# Patient Record
Sex: Male | Born: 1970 | Race: White | Hispanic: No | Marital: Married | Smoking: Former smoker
Health system: Southern US, Community
[De-identification: ages and names within clinical notes are randomized; demographics above are authoritative.]

## PROBLEM LIST (undated history)

## (undated) DIAGNOSIS — K219 Gastro-esophageal reflux disease without esophagitis: Secondary | ICD-10-CM

---

## 2007-04-01 ENCOUNTER — Emergency Department (HOSPITAL_COMMUNITY): Admission: EM | Admit: 2007-04-01 | Discharge: 2007-04-01 | Payer: Self-pay | Admitting: Family Medicine

## 2019-11-27 ENCOUNTER — Ambulatory Visit: Payer: BC Managed Care – PPO | Attending: Internal Medicine

## 2019-11-27 DIAGNOSIS — Z23 Encounter for immunization: Secondary | ICD-10-CM

## 2019-11-27 NOTE — Progress Notes (Signed)
   Covid-19 Vaccination Clinic  Name:  DRAYDON CLAIRMONT    MRN: [id number] DOB: 24-Jun-1971  11/27/2019  Mr. Vi was observed post Covid-19 immunization for 15 minutes without incident. He was provided with Vaccine Information Sheet and instruction to access the V-Safe system.   Mr. Kohlmeyer was instructed to call 911 with any severe reactions post vaccine: Marland Kitchen Difficulty breathing  . Swelling of face and throat  . A fast heartbeat  . A bad rash all over body  . Dizziness and weakness   Immunizations Administered    Name Date Dose VIS Date Route   Pfizer COVID-19 Vaccine 11/27/2019  1:01 PM 0.3 mL 08/22/2019 Intramuscular   Manufacturer: [employer], [employer]   Lot: [id number]   NDC: [address]-4

## 2019-12-22 ENCOUNTER — Ambulatory Visit: Payer: BC Managed Care – PPO | Attending: Internal Medicine

## 2019-12-22 DIAGNOSIS — Z23 Encounter for immunization: Secondary | ICD-10-CM

## 2019-12-22 NOTE — Progress Notes (Signed)
   Covid-19 Vaccination Clinic  Name:  Tom Ellison    MRN: [id number] DOB: 10/20/1970  12/22/2019  Tom Ellison was observed post Covid-19 immunization for 15 minutes without incident. He was provided with Vaccine Information Sheet and instruction to access the V-Safe system.   Tom Ellison was instructed to call 911 with any severe reactions post vaccine: Marland Kitchen Difficulty breathing  . Swelling of face and throat  . A fast heartbeat  . A bad rash all over body  . Dizziness and weakness   Immunizations Administered    Name Date Dose VIS Date Route   Pfizer COVID-19 Vaccine 12/22/2019  2:01 PM 0.3 mL 08/22/2019 Intramuscular   Manufacturer: [employer], [employer]   Lot: [id number]   NDC: [address]-2

## 2020-04-25 ENCOUNTER — Emergency Department (HOSPITAL_COMMUNITY)
Admission: EM | Admit: 2020-04-25 | Discharge: 2020-04-26 | Disposition: A | Discharge status: Home / Self Care | Payer: BC Managed Care – PPO | Attending: Emergency Medicine | Admitting: Emergency Medicine

## 2020-04-25 ENCOUNTER — Other Ambulatory Visit: Payer: Self-pay

## 2020-04-25 ENCOUNTER — Emergency Department (HOSPITAL_COMMUNITY): Payer: BC Managed Care – PPO

## 2020-04-25 ENCOUNTER — Encounter (HOSPITAL_COMMUNITY): Payer: Self-pay | Admitting: Emergency Medicine

## 2020-04-25 DIAGNOSIS — Z5321 Procedure and treatment not carried out due to patient leaving prior to being seen by health care provider: Secondary | ICD-10-CM | POA: Diagnosis not present

## 2020-04-25 DIAGNOSIS — R0789 Other chest pain: Secondary | ICD-10-CM | POA: Insufficient documentation

## 2020-04-25 HISTORY — DX: Gastro-esophageal reflux disease without esophagitis: K21.9

## 2020-04-25 LAB — CBC
HCT: 46.4 % (ref 39.0–52.0)
Hemoglobin: 15.6 g/dL (ref 13.0–17.0)
MCH: 29.4 pg (ref 26.0–34.0)
MCHC: 33.6 g/dL (ref 30.0–36.0)
MCV: 87.4 fL (ref 80.0–100.0)
Platelets: 313 10*3/uL (ref 150–400)
RBC: 5.31 MIL/uL (ref 4.22–5.81)
RDW: 12.6 % (ref 11.5–15.5)
WBC: 8.1 10*3/uL (ref 4.0–10.5)
nRBC: 0 % (ref 0.0–0.2)

## 2020-04-25 LAB — BASIC METABOLIC PANEL
Anion gap: 11 (ref 5–15)
BUN: 13 mg/dL (ref 6–20)
CO2: 25 mmol/L (ref 22–32)
Calcium: 9.5 mg/dL (ref 8.9–10.3)
Chloride: 102 mmol/L (ref 98–111)
Creatinine, Ser: 0.89 mg/dL (ref 0.61–1.24)
GFR calc Af Amer: >60 mL/min (ref >60)
GFR calc non Af Amer: >60 mL/min (ref >60)
Glucose, Bld: 283 mg/dL — ABNORMAL HIGH (ref 70–99)
Potassium: 4 mmol/L (ref 3.5–5.1)
Sodium: 138 mmol/L (ref 135–145)

## 2020-04-25 LAB — TROPONIN I (HIGH SENSITIVITY): Troponin I (High Sensitivity): 3 ng/L (ref <18)

## 2020-04-25 NOTE — ED Triage Notes (Signed)
Pt c/o intermittent, non-radiating left sided chest pain x 2 days. Denies shortness of breath.

## 2020-04-26 ENCOUNTER — Encounter: Payer: Self-pay | Admitting: Cardiology

## 2020-04-26 ENCOUNTER — Ambulatory Visit: Payer: BC Managed Care – PPO | Admitting: Cardiology

## 2020-04-26 VITALS — BP 153/82 | HR 72 | Resp 16 | Ht 68.0 in | Wt 179.8 lb

## 2020-04-26 DIAGNOSIS — Z794 Long term (current) use of insulin: Secondary | ICD-10-CM | POA: Insufficient documentation

## 2020-04-26 DIAGNOSIS — I10 Essential (primary) hypertension: Secondary | ICD-10-CM | POA: Insufficient documentation

## 2020-04-26 DIAGNOSIS — I209 Angina pectoris, unspecified: Secondary | ICD-10-CM

## 2020-04-26 DIAGNOSIS — E119 Type 2 diabetes mellitus without complications: Secondary | ICD-10-CM

## 2020-04-26 LAB — CBG MONITORING, ED: Glucose-Capillary: 167 mg/dL — ABNORMAL HIGH (ref 70–99)

## 2020-04-26 MED ORDER — ASPIRIN 81 MG PO CHEW: 81.0000 mg | CHEWABLE_TABLET | Freq: Every day | ORAL | 2 refills | Status: AC

## 2020-04-26 MED ORDER — METOPROLOL TARTRATE 50 MG PO TABS: 50.0000 mg | ORAL_TABLET | Freq: Two times a day (BID) | ORAL | 3 refills | Status: AC

## 2020-04-26 MED ORDER — NITROGLYCERIN 0.4 MG SL SUBL: 0.4000 mg | SUBLINGUAL_TABLET | SUBLINGUAL | 3 refills | Status: AC | PRN

## 2020-04-26 NOTE — ED Notes (Signed)
Pt stated he saw labs on my chart and did not want to wait to see a doctor and left the building

## 2020-04-26 NOTE — Progress Notes (Signed)
Patient referred by Tom Ellison, [place] for chest pain  Subjective:   Tom Ellison, male    DOB: [birth year], 49 y.o.   MRN: [id number]   Chief Complaint  Patient presents with  . Chest Pain  . New Patient (Initial Visit)    Referred by PCP Tom Aly, PA     HPI  49 year old male with hypertension, type 2 DM, tobacco dependence, here for evaluation of chest pain.  [facility] went to [facility] on 04/25/2020 with complaints of chest pain.  EKG did not show acute myocardial infarction.  Troponin HS was 3.  Left the ED before being evaluated by physician, due long wait time. His PCP referred him for an acute visit today with me.  Patient is here with his wife today. On 8/14, patient had episodes of "jolt in his chest" while at rest, but worse with exertion. Episodes are also associated with shortness of breath. He has not done any significant physical activity since then.   Patient has longstanding hypertension, type 2 DM (HbA1C 7.1%), former smoker, and continues to chew tobacco.    Past Medical History:  Diagnosis Date  . GERD (gastroesophageal reflux disease)      History reviewed. No pertinent surgical history.   Social History   Tobacco Use  Smoking Status Current Every Day Smoker    Social History   Substance and Sexual Activity  Alcohol Use Yes     Family History  Problem Relation Age of Onset  . Heart attack Father      Current Outpatient Medications on File Prior to Visit  Medication Sig Dispense Refill  . atorvastatin (LIPITOR) 20 MG tablet Take 20 mg by mouth every evening.    . gabapentin (NEURONTIN) 300 MG capsule Take 600 mg by mouth 2 (two) times daily.     . Insulin Detemir (LEVEMIR FLEXPEN [place]) Inject 58 Units into the skin every evening.    Marland Kitchen losartan (COZAAR) 100 MG tablet Take 100 mg by mouth daily.    . metFORMIN (GLUCOPHAGE) 1000 MG tablet Take 1,000 mg by mouth 2 (two) times daily with a meal.    . sitaGLIPtin (JANUVIA) 100  MG tablet Take 100 mg by mouth daily.    . Testosterone Cypionate 200 MG/ML SOLN Inject 200 mg as directed every 21 ( twenty-one) days.      No current facility-administered medications on file prior to visit.    Cardiovascular and other pertinent studies:  EKG 04/25/2020: Sinus rhythm 91 bpm with sinus arrhythmia.  Left intrafascicular block.  Poor R wave progression.  No ischemic changes.   Recent labs: 04/25/2020: Glucose 283. BUN/Cr 13/0.89. eGFR >60. Na/K 138/4.2. H/H 15/46. MCV 87. Platelets 313 Trop HS 3   Review of Systems  Cardiovascular: Positive for chest pain and dyspnea on exertion. Negative for leg swelling, palpitations and syncope.  Respiratory: Positive for shortness of breath.         Vitals:   04/26/20 1334  BP: (!) 153/82  Pulse: 72  Resp: 16  SpO2: 97%     Body mass index is 27.34 kg/m. Filed Weights   04/26/20 1334  Weight: 179 lb 12.8 oz (81.6 kg)     Objective:   Physical Exam Vitals and nursing note reviewed.  Constitutional:      General: He is not in acute distress. Neck:     Vascular: No JVD.  Cardiovascular:     Rate and Rhythm: Normal rate and regular rhythm.  Heart sounds: Normal heart sounds. No murmur heard.   Pulmonary:     Effort: Pulmonary effort is normal.     Breath sounds: Normal breath sounds. No wheezing or rales.         Assessment & Recommendations:   49 year old male with hypertension, type 2 DM, tobacco dependence, family h/o CAD, referred for evaluation of chest pain.  Chest pain: Concerning for angina.  Recommend Aspirin 81 mg, metoprolol tartarate 50 mg bid, prn SL NTG. Continue atorvastatin 20 mg, losartan 100 mg.  Recommend coronary CT angiogram.  Patient knows to call 911 should he have chest pain episode not resolved with NTGX2.  Hypertension: Uncontrolled. Added metoprolol.  Type 2 DM: Management as per PCP  Further recommendations after above testing.   Thank you for referring the  patient to [name]. Please feel free to contact with any questions.  Lenetta Piche Esther Hardy, MD [place], MD [facility] Pager: [phone] Office: [phone]

## 2020-04-26 NOTE — Patient Instructions (Signed)
Your cardiac CT will be scheduled at the locations below:   [facility]  [address]  [place], [place] [address]  [phone]    If scheduled at [facility], please arrive at the [facility] main entrance of [facility] 30-45 minutes prior to test start time.  Proceed to the [facility] Radiology Department (first floor) to check-in and test prep.   Please follow these instructions carefully (unless otherwise directed):    Hold all erectile dysfunction medications at least 3 days (72 hrs) prior to test.   On the Night Before the Test:   Be sure to Drink plenty of water.   Do not consume any caffeinated/decaffeinated beverages or chocolate 12 hours prior to your test.   Do not take any antihistamines 12 hours prior to your test.   If the patient has contrast allergy:  Patient will need a prescription for Prednisone and very clear instructions (as follows):  1. Prednisone 50 mg - take 13 hours prior to test  2. Take another Prednisone 50 mg 7 hours prior to test  3. Take another Prednisone 50 mg 1 hour prior to test  4. Take Benadryl 50 mg 1 hour prior to test   Patient must complete all four doses of above prophylactic medications.   Patient will need a ride after test due to Benadryl.   On the Day of the Test:   Drink plenty of water. Do not drink any water within one hour of the test.   Do not eat any food 4 hours prior to the test.   You may take your regular medications prior to the test.    - Metoprolol tartarate Take your 50 mg tablet 2 hours before CT scan         After the Test:   Drink plenty of water.   After receiving IV contrast, you may experience a mild flushed feeling. This is normal.   On occasion, you may experience a mild rash up to 24 hours after the test. This is not dangerous. If this occurs, you can take Benadryl 25 mg and increase your fluid intake.   If you experience trouble breathing, this can be  serious. If it is severe call 911 IMMEDIATELY. If it is mild, please call our office.   If you take any of these medications: Glipizide/Metformin, Avandament, Glucavance, please do not take 48 hours after completing test unless otherwise instructed.     Please contact the cardiac imaging nurse navigator should you have any questions/concerns  Rockwell Alexandria, RN Navigator Cardiac Imaging  [facility]  [phone] Office  [phone] Cell

## 2020-04-28 ENCOUNTER — Other Ambulatory Visit: Payer: Self-pay

## 2020-04-28 ENCOUNTER — Ambulatory Visit: Payer: BC Managed Care – PPO

## 2020-04-28 DIAGNOSIS — I209 Angina pectoris, unspecified: Secondary | ICD-10-CM

## 2020-04-30 ENCOUNTER — Encounter: Payer: Self-pay | Admitting: Cardiology

## 2020-05-11 ENCOUNTER — Telehealth (HOSPITAL_COMMUNITY): Payer: Self-pay | Admitting: *Deleted

## 2020-05-11 NOTE — Telephone Encounter (Signed)

## 2020-05-12 ENCOUNTER — Other Ambulatory Visit: Payer: Self-pay

## 2020-05-12 ENCOUNTER — Ambulatory Visit (HOSPITAL_COMMUNITY)
Admission: RE | Admit: 2020-05-12 | Discharge: 2020-05-12 | Disposition: A | Discharge status: Home / Self Care | Payer: BC Managed Care – PPO | Source: Ambulatory Visit | Attending: Cardiology | Admitting: Cardiology

## 2020-05-12 DIAGNOSIS — I209 Angina pectoris, unspecified: Secondary | ICD-10-CM | POA: Insufficient documentation

## 2020-05-12 MED ORDER — NITROGLYCERIN 0.4 MG SL SUBL
0.8000 mg | SUBLINGUAL_TABLET | Freq: Once | SUBLINGUAL | Status: AC
Start: 2020-05-12 — End: 2020-05-12

## 2020-05-12 MED ORDER — IOHEXOL 350 MG/ML SOLN
80.0000 mL | Freq: Once | INTRAVENOUS | Status: AC | PRN
  Administered 2020-05-12: 80 mL via INTRAVENOUS

## 2020-05-12 MED ORDER — NITROGLYCERIN 0.4 MG SL SUBL
SUBLINGUAL_TABLET | SUBLINGUAL | Status: AC
  Administered 2020-05-12: 0.8 mg via SUBLINGUAL
  Filled 2020-05-12: qty 2

## 2020-05-15 ENCOUNTER — Ambulatory Visit (HOSPITAL_COMMUNITY)
Admission: RE | Admit: 2020-05-15 | Discharge: 2020-05-15 | Disposition: A | Discharge status: Home / Self Care | Payer: BC Managed Care – PPO | Source: Ambulatory Visit | Attending: Cardiology | Admitting: Cardiology

## 2020-05-15 DIAGNOSIS — I209 Angina pectoris, unspecified: Secondary | ICD-10-CM

## 2020-05-18 DIAGNOSIS — R0789 Other chest pain: Secondary | ICD-10-CM | POA: Insufficient documentation

## 2020-05-18 NOTE — Progress Notes (Signed)
Patient referred by Vicenta Aly, [place] for chest pain  Subjective:   Tom Ellison, male    DOB: Dec 04, 1970, 49 y.o.   MRN: [id number]   Chief Complaint  Patient presents with  . Chest Pain  . Follow-up     HPI  49 year old male with hypertension, type 2 DM, tobacco dependence, nonobstructive CAD, family h/o CAD  Patient underwent CCTA given symptoms of chest pain. CTA showed mild nonobstructive (FFR negative) CAD in prox LAD and RCA.   He continues to have exertional dyspnea activity such as walking to the mailbox and back.  The symptoms have been fairly abrupt onset over the last few weeks.  He still has occasional episodes of sharp chest pain lasting for few seconds, unrelated to physical activity.  Current Outpatient Medications on File Prior to Visit  Medication Sig Dispense Refill  . aspirin (ASPIRIN CHILDRENS) 81 MG chewable tablet Chew 1 tablet (81 mg total) by mouth daily. 30 tablet 2  . atorvastatin (LIPITOR) 20 MG tablet Take 20 mg by mouth every evening.    . gabapentin (NEURONTIN) 300 MG capsule Take 600 mg by mouth 2 (two) times daily.     . Insulin Detemir (LEVEMIR FLEXPEN [place]) Inject 58 Units into the skin every evening.    Marland Kitchen losartan (COZAAR) 100 MG tablet Take 100 mg by mouth daily.    . metFORMIN (GLUCOPHAGE) 1000 MG tablet Take 1,000 mg by mouth 2 (two) times daily with a meal.    . metoprolol tartrate (LOPRESSOR) 50 MG tablet Take 1 tablet (50 mg total) by mouth 2 (two) times daily. 60 tablet 3  . nitroGLYCERIN (NITROSTAT) 0.4 MG SL tablet Place 1 tablet (0.4 mg total) under the tongue every 5 (five) minutes as needed for chest pain. 30 tablet 3  . sitaGLIPtin (JANUVIA) 100 MG tablet Take 100 mg by mouth daily.    . Testosterone Cypionate 200 MG/ML SOLN Inject 200 mg as directed every 21 ( twenty-one) days.      No current facility-administered medications on file prior to visit.    Cardiovascular and other pertinent studies:  CTA cor 05/12/2020: 1.  Coronary calcium score of 39 AU. This was 3 percentile for age and sex matched control. 2. Normal coronary origin with right dominance. 3. CADRADS = 2. 4. Proximal LAD has mild stenosis in due to mixed plaque. LCX is non dominant and overall patent. Proximal RCA has mild stenosis due to noncalcified plaque. 5.  Aortic atherosclerosis. CT FFR findings: 1. Left Main: FFR = 0.99 2. LAD: Proximal FFR = 0.98, Mid FFR = 0.94, Distal FFR = 0.85 3. LCX: Proximal FFR = 0.98, Distal FFR = 0.94 4. RCA: Proximal FFR = 0.97, Mid FFR =0.91, Distal FFR = 0.87  IMPRESSION: 1.  CT FFR analysis showed no significant stenosis.  Echocardiogram 04/28/2020:  Left ventricle cavity is normal in size and wall thickness. Mild global  hypokinesis. LVEF 45-50%. Doppler evidence of grade II (pseudonormal)  diastolic dysfunction, elevated LAP. Calculated EF 55%.  Left atrial cavity is mildly dilated.  Moderate (Grade II) mitral regurgitation.  Inadequate TR jet to estimate pulmonary artery systolic pressure. Normal  right atrial pressure.   EKG 04/25/2020: Sinus rhythm 91 bpm with sinus arrhythmia.  Left intrafascicular block.  Poor R wave progression.  No ischemic changes.   Recent labs: 04/25/2020: Glucose 283. BUN/Cr 13/0.89. eGFR >60. Na/K 138/4.2. H/H 15/46. MCV 87. Platelets 313 Trop HS 3   Review of Systems  Cardiovascular: Positive  for chest pain and dyspnea on exertion. Negative for leg swelling, palpitations and syncope.  Respiratory: Positive for shortness of breath.         Vitals:   05/19/20 1611  BP: 122/85  Pulse: 76  Resp: 16  SpO2: 96%     Body mass index is 27.37 kg/m. Filed Weights   05/19/20 1611  Weight: 180 lb (81.6 kg)     Objective:   Physical Exam Vitals and nursing note reviewed.  Constitutional:      General: He is not in acute distress. Neck:     Vascular: No JVD.  Cardiovascular:     Rate and Rhythm: Normal rate and regular rhythm.     Heart  sounds: Normal heart sounds. No murmur heard.   Pulmonary:     Effort: Pulmonary effort is normal.     Breath sounds: Normal breath sounds. No wheezing or rales.         Assessment & Recommendations:   50 year old male with hypertension, type 2 DM, tobacco dependence, nonobstructive CAD, family h/o CAD  Exertional dyspnea, atypical chest pain: Mild nonobstructive CAD seen on CTA, mild systolic and diastolic dysfunction do not fully explain his symptoms.  While COPD is possible, fairly abrupt onset of symptoms raise concern about pulmonary embolism.  I discussed proceeding with CTA versus D-dimer.  Patient would like to obtain D-dimer first.  If positive, will proceed with CTA.  Given his smoking history, VQ scan likely not a good test for him.  CAD: Mild, nonobstructive. Recommend aggressive risk factor modification. Recommend Aspirin 81 mg, metoprolol tartarate 50 mg bid, losartan 100 mg.  Increase atorvastatin to 40 mg   Hypertension:  Well controlled on metoprolol and losartan  Type 2 DM: Management as per PCP  Repeat lipid panel and echocardiogram in 6 months, follow-up after that.   Nigel Mormon, MD Pager: [phone] Office: [phone]

## 2020-05-19 ENCOUNTER — Other Ambulatory Visit: Payer: Self-pay

## 2020-05-19 ENCOUNTER — Encounter: Payer: Self-pay | Admitting: Cardiology

## 2020-05-19 ENCOUNTER — Ambulatory Visit: Payer: BC Managed Care – PPO | Admitting: Cardiology

## 2020-05-19 VITALS — BP 122/85 | HR 76 | Resp 16 | Ht 68.0 in | Wt 180.0 lb

## 2020-05-19 DIAGNOSIS — I10 Essential (primary) hypertension: Secondary | ICD-10-CM

## 2020-05-19 DIAGNOSIS — I2510 Atherosclerotic heart disease of native coronary artery without angina pectoris: Secondary | ICD-10-CM

## 2020-05-19 DIAGNOSIS — R0609 Other forms of dyspnea: Secondary | ICD-10-CM

## 2020-05-19 DIAGNOSIS — R0789 Other chest pain: Secondary | ICD-10-CM

## 2020-05-19 DIAGNOSIS — E119 Type 2 diabetes mellitus without complications: Secondary | ICD-10-CM

## 2020-05-19 MED ORDER — ATORVASTATIN CALCIUM 40 MG PO TABS: 40.0000 mg | ORAL_TABLET | Freq: Every evening | ORAL | 2 refills | Status: DC

## 2020-05-25 LAB — D-DIMER, QUANTITATIVE: D-DIMER: <0.2 mg/L FEU (ref 0.00–0.49)

## 2020-06-09 ENCOUNTER — Other Ambulatory Visit: Payer: Self-pay

## 2020-06-09 ENCOUNTER — Encounter: Payer: Self-pay | Admitting: Pulmonary Disease

## 2020-06-09 ENCOUNTER — Ambulatory Visit (INDEPENDENT_AMBULATORY_CARE_PROVIDER_SITE_OTHER): Payer: BC Managed Care – PPO | Admitting: Pulmonary Disease

## 2020-06-09 VITALS — BP 120/78 | HR 69 | Temp 97.8°F | Ht 68.0 in | Wt 189.0 lb

## 2020-06-09 DIAGNOSIS — J45909 Unspecified asthma, uncomplicated: Secondary | ICD-10-CM | POA: Insufficient documentation

## 2020-06-09 DIAGNOSIS — R0600 Dyspnea, unspecified: Secondary | ICD-10-CM

## 2020-06-09 DIAGNOSIS — R0609 Other forms of dyspnea: Secondary | ICD-10-CM

## 2020-06-09 DIAGNOSIS — R05 Cough: Secondary | ICD-10-CM

## 2020-06-09 DIAGNOSIS — R059 Cough, unspecified: Secondary | ICD-10-CM

## 2020-06-09 DIAGNOSIS — J4540 Moderate persistent asthma, uncomplicated: Secondary | ICD-10-CM

## 2020-06-09 MED ORDER — BREO ELLIPTA 100-25 MCG/INH IN AEPB: 1.0000 | INHALATION_SPRAY | Freq: Every day | RESPIRATORY_TRACT | 11 refills | Status: DC

## 2020-06-09 NOTE — Progress Notes (Signed)
Patient ID: Tom Ellison, male    DOB: [birth year], 49 y.o.   MRN: [id number]  Chief Complaint  Patient presents with  . Consult    SOB while talking and walking. Also having some chest pain. Dry cough    Referring provider: Elizabeth PalauAnderson, Teresa, FNP  HPI:   Tom Ellison is a 49 year old man with past meters of diabetes, hypertension who are seen in consultation at the request of Elizabeth Palaueresa Anderson, NP for evaluation of dyspnea on exertion.  There is from referring provider as well as cardiology reviewed.  States that he developed shortness of breath out of the blue about 1 month ago.  Short of breath walking to be mailbox.  Short of breath at work going up and down ladders, performing tasks.  Occasion become somewhat short of breath at rest as well.  He describes in the past history of asthma.  In the past clear triggers of what sound like organic dusts and fumes.  He describes more recent triggers as smells such as cooking meat in the house.  This is associated with cough.  Is nonproductive.  Occurs throughout the day.  No clear timing.  No clear exacerbating or alleviating factors.  Rest seems to help the shortness of breath when he is exerting himself.  He has used albuterol prescribed by his PCP that seems to take some of the edge off of the shortness of breath and this helps somewhat.  Reviewed recent CT chest portion of CT coronary which demonstrates on my interpretation clear lungs, apices not well visualized.  Review chest x-ray from August 2021 which demonstrate clear lungs on my interpretation.  Reviewed prior lab results and eosinophils ranged from 1-200.  No IgE in the system.  PMH: Diabetes, hypertension, hyperlipidemia Surgical history: Reviewed, none. Family history: CAD in father Social history: [place] in [place], former smoker, 30-pack-year history   Questionaires / Pulmonary Flowsheets:   ACT:  No flowsheet data found.  MMRC: No flowsheet data found.  Epworth:  No  flowsheet data found.  Tests:   FENO:  No results found for: NITRICOXIDE  PFT: No flowsheet data found.  WALK:  No flowsheet data found.  Imaging: CT CORONARY MORPH W/CTA COR W/SCORE W/CA W/CM &/OR WO/CM  Addendum Date: 05/15/2020   ADDENDUM REPORT: 05/15/2020 17:09 HISTORY: 49 year old male with hypertension, type 2 DM, tobacco dependence, family h/o CAD, referred for evaluation of chest pain after recent ER visit. EXAM: Cardiac/Coronary  CT TECHNIQUE: The patient was scanned on a [employer] Force scanner. PROTOCOL: A 120 kV prospective scan was triggered in the descending thoracic aorta at 111 HU's. Axial non-contrast 3 mm slices were carried out through the heart. The data set was analyzed on a dedicated work station and scored using the Agatson method. Gantry rotation speed was 250 msecs and collimation was .6 mm. No IV beta blockade and 0.8 mg of sl NTG was given. The 3D data set was reconstructed in 5% intervals of the 67-82 % of the R-R cycle. Diastolic phases were analyzed on a dedicated work station using MPR, MIP and VRT modes. The patient received 80mL OMNIPAQUE IOHEXOL 350 MG/ML SOLN of contrast. FINDINGS: Image quality: Excellent. Noise artifact is: Limited. Coronary artery calcification score: Coronary calcium score is 39 AU, which places the patient in the 4882 percentile for age and sex matched control. Coronary arteries: Normal coronary origins.  Right dominance. Left Main Coronary Artery: The left main is a normal caliber vessel that bifurcates to form a left  anterior descending artery and a left circumflex artery. There is no plaque or stenosis. Left Anterior Descending Coronary Artery: The LAD gives off 1 patent diagonal branches and wraps the apex. Proximal segment has mild stenosis (25-49%) due to calcified and noncalcified plaque. Proximal to mid segment has minimal stenosis (<25%) due to calcified plaque. Remaining of the vessel is overall patent with luminal irregularities.  Diagonal 1 branch is overall pain without plaque or stenosis. Left Circumflex Artery: The LCX gives off 1 patent obtuse marginal branches. The LCX is non-dominant and patent with no evidence of plaque or stenosis. Right Coronary Artery: The RCA is dominant vessel which terminates as a PDA and right posterolateral branch. Minimal stenosis (<25%) due to calcified plaque at the ostial RCA. Proximal segment has mild stenosis (24-49%) due to noncalcified plaque. Remaining of the vessels overall patent without plaque or stenosis. Left Atrium: No left atrial appendage filling defect. Left Ventricle: The ventricular cavity size is grossly within normal limits. There is no abnormal filling defect. No VSD. Pulmonary arteries: Main pulmonary branch measures 32 mm without proximal filling defect. Pulmonary veins: Normal pulmonary venous drainage. Aorta: Normal size, 32 mm at the mid ascending aorta (level of the PA bifurcation) measured double oblique. Aortic atherosclerosis. No dissection. Pericardium: Normal thickness with no significant effusion or calcium present. Cardiac valves: The aortic valve without significant calcification. The mitral valve is normal structure with mitral annular calcification. Extra-cardiac findings: See attached radiology report for non-cardiac structures. IMPRESSION: 1. Coronary calcium score of 39 AU. This was 52 percentile for age and sex matched control. 2. Normal coronary origin with right dominance. 3. CADRADS = 2. 4. Proximal LAD has mild stenosis in due to mixed plaque. LCX is non dominant and overall patent. Proximal RCA has mild stenosis due to noncalcified plaque. 5.  Aortic atherosclerosis. 6. Study is sent for CT-FFR findings will be performed and reported separately. RECOMMENDATIONS: CAD-RADS 2: Mild non-obstructive CAD (25-49%). Consider non-atherosclerotic causes of chest pain. Consider preventive therapy and risk factor modification. Electronically Signed   By: Tessa Lerner   On:  05/15/2020 17:09   Result Date: 05/15/2020 EXAM: OVER-READ INTERPRETATION  CT CHEST The following report is an over-read performed by radiologist Dr. Trudie Reed of [facility] on 05/12/2020. This over-read does not include interpretation of cardiac or coronary anatomy or pathology. The coronary calcium score/coronary CTA interpretation by the cardiologist is attached. COMPARISON:  None. FINDINGS: Aortic atherosclerosis. Within the visualized portions of the thorax there are no suspicious appearing pulmonary nodules or masses, there is no acute consolidative airspace disease, no pleural effusions, no pneumothorax and no lymphadenopathy. Visualized portions of the upper abdomen are unremarkable. There are no aggressive appearing lytic or blastic lesions noted in the visualized portions of the skeleton. IMPRESSION: 1.  Aortic Atherosclerosis (ICD10-I70.0). Electronically Signed: By: Trudie Reed M.D. On: 05/12/2020 13:29   CT CORONARY FRACTIONAL FLOW RESERVE DATA PREP  Result Date: 05/16/2020 EXAM: CT FFR ANALYSIS CLINICAL DATA:  49 year old male with hypertension, type 2 DM, tobacco dependence, family history of CAD, referred for evaluation of chest pain after recent ER visit. FINDINGS: FFRct analysis was performed on the original cardiac CT angiogram dataset. Diagrammatic representation of the FFRct analysis is provided in a separate PDF document in PACS. This dictation was created using the PDF document and an interactive 3D model of the results. 3D model is not available in the EMR/PACS. Normal FFR range is >0.80. Indeterminate (grey) zone is 0.76-0.80. 1. Left Main: FFR =  0.99 2. LAD: Proximal FFR = 0.98, Mid FFR = 0.94, Distal FFR = 0.85 3. LCX: Proximal FFR = 0.98, Distal FFR = 0.94 4. RCA: Proximal FFR = 0.97, Mid FFR =0.91, Distal FFR = 0.87 IMPRESSION: 1.  CT FFR analysis showed no significant stenosis. RECOMMENDATIONS: Goal directed medical therapy and aggressive risk factor  modification for secondary prevention of coronary artery disease. Electronically Signed   By: Tessa Lerner   On: 05/16/2020 13:00    Lab Results:  CBC    Component Value Date/Time   WBC 8.1 04/25/2020 1907   RBC 5.31 04/25/2020 1907   HGB 15.6 04/25/2020 1907   HCT 46.4 04/25/2020 1907   PLT 313 04/25/2020 1907   MCV 87.4 04/25/2020 1907   MCH 29.4 04/25/2020 1907   MCHC 33.6 04/25/2020 1907   RDW 12.6 04/25/2020 1907    BMET    Component Value Date/Time   NA 138 04/25/2020 1907   K 4.0 04/25/2020 1907   CL 102 04/25/2020 1907   CO2 25 04/25/2020 1907   GLUCOSE 283 (H) 04/25/2020 1907   BUN 13 04/25/2020 1907   CREATININE 0.89 04/25/2020 1907   CALCIUM 9.5 04/25/2020 1907   GFRNONAA >60 04/25/2020 1907   GFRAA >60 04/25/2020 1907    BNP No results found for: BNP  ProBNP No results found for: PROBNP  Specialty Problems      Pulmonary Problems   Asthma      No Known Allergies  Immunization History  Administered Date(s) Administered  . PFIZER SARS-COV-2 Vaccination 11/27/2019, 12/22/2019    Past Medical History:  Diagnosis Date  . GERD (gastroesophageal reflux disease)     Tobacco History: Social History   Tobacco Use  Smoking Status Former Smoker  . Years: 15.00  . Types: Cigarettes  . Quit date: 2012  . Years since quitting: 9.7  Smokeless Tobacco Current User  . Types: Chew   Ready to quit: Not Answered Counseling given: Not Answered   Continue to not smoke  Outpatient Encounter Medications as of 06/09/2020  Medication Sig  . aspirin (ASPIRIN CHILDRENS) 81 MG chewable tablet Chew 1 tablet (81 mg total) by mouth daily.  Marland Kitchen atorvastatin (LIPITOR) 40 MG tablet Take 1 tablet (40 mg total) by mouth every evening.  . gabapentin (NEURONTIN) 300 MG capsule Take 600 mg by mouth 2 (two) times daily.   . Insulin Detemir (LEVEMIR FLEXPEN [place]) Inject 58 Units into the skin every evening.  Marland Kitchen losartan (COZAAR) 100 MG tablet Take 100 mg by mouth daily.    . metFORMIN (GLUCOPHAGE) 1000 MG tablet Take 1,000 mg by mouth 2 (two) times daily with a meal.  . metoprolol tartrate (LOPRESSOR) 50 MG tablet Take 1 tablet (50 mg total) by mouth 2 (two) times daily.  . nitroGLYCERIN (NITROSTAT) 0.4 MG SL tablet Place 1 tablet (0.4 mg total) under the tongue every 5 (five) minutes as needed for chest pain.  . sitaGLIPtin (JANUVIA) 100 MG tablet Take 100 mg by mouth daily.  . Testosterone Cypionate 200 MG/ML SOLN Inject 200 mg as directed every 21 ( twenty-one) days.   Marland Kitchen albuterol (VENTOLIN HFA) 108 (90 Base) MCG/ACT inhaler Inhale 2 puffs into the lungs every 4 (four) hours as needed.  . fluticasone furoate-vilanterol (BREO ELLIPTA) 100-25 MCG/INH AEPB Inhale 1 puff into the lungs daily.   No facility-administered encounter medications on file as of 06/09/2020.     Review of Systems  Review of Systems  No orthopnea or PND.  No  lower extremity swelling.  Comprehensive review of systems otherwise negative. Physical Exam  BP 120/78 (BP Location: Right Arm, Cuff Size: Normal)   Pulse 69   Temp 97.8 F (36.6 C) (Oral)   Ht 5\' 8"  (1.727 m)   Wt 189 lb (85.7 kg)   SpO2 97%   BMI 28.74 kg/m   Wt Readings from Last 5 Encounters:  06/09/20 189 lb (85.7 kg)  05/19/20 180 lb (81.6 kg)  04/26/20 179 lb 12.8 oz (81.6 kg)    BMI Readings from Last 5 Encounters:  06/09/20 28.74 kg/m  05/19/20 27.37 kg/m  04/26/20 27.34 kg/m     Physical Exam General: Well-appearing, no acute distress Eyes: EOMI, no icterus Neck: No JVP appreciated, supple Respiratory: Clear aspiration bilaterally, no wheeze Cardiovascular: Regular rhythm, no murmurs Abdomen: Soft, not sound present MSK: No synovitis, no joint effusions Neuro: Normal gait, no weakness Psych: Normal mood, full affect   Assessment & Plan:   Dyspnea exertion: New, relatively abrupt onset the last month or so.  No clear inciting event.  No URI symptoms that I can tell.  He has history of  asthma and reports clear triggers leading to worsening breathing over the last several weeks.  Suspect this is recrudescence of asthma symptoms.  He has significant diastolic dysfunction and LAD enlargement on echo which points to element of chronic volume overload.  It is possible he has worsening diastolic dysfunction with exercise and dynamic pulmonary edema that leads to symptoms.  However his lung sound clear today and appears euvolemic.  All cough can be present with pulmonary edema better fit for asthma at this time.  We will start ICS/LABA with mid dose of Breo.  Will obtain PFTs in the coming days.  Cough: Suspect related to asthma.  Has no nasal congestion or postnasal drip symptoms.  Already on PPI for GERD denies any heartburn or reflux symptoms.  ICS/LABA as above.    Return in about 3 months (around 09/08/2020).   09/10/2020, MD 06/09/2020

## 2020-06-09 NOTE — Patient Instructions (Signed)
Nice to meet you!  Use Breo 1 puff once a day ever day. This is to treat asthma which I think is the main reason you are having breathing difficulties and cough. Use the albuterol inhaler as needed for episodes of shortness of breath.  Schedule PFTs (breathing tests) in the next few days. Do not use Breo on the day of your breathing tests. You can take it after you have completed the breathing tests.   Come back in 3 months with Dr. Judeth Horn.

## 2020-06-11 ENCOUNTER — Other Ambulatory Visit: Payer: Self-pay

## 2020-06-11 ENCOUNTER — Ambulatory Visit (INDEPENDENT_AMBULATORY_CARE_PROVIDER_SITE_OTHER): Payer: BC Managed Care – PPO | Admitting: Pulmonary Disease

## 2020-06-11 DIAGNOSIS — R0600 Dyspnea, unspecified: Secondary | ICD-10-CM | POA: Diagnosis not present

## 2020-06-11 DIAGNOSIS — R0609 Other forms of dyspnea: Secondary | ICD-10-CM

## 2020-06-11 LAB — PULMONARY FUNCTION TEST
DL/VA % pred: 93 %
DL/VA: 4.19 ml/min/mmHg/L
DLCO cor % pred: 101 %
DLCO cor: 28.22 ml/min/mmHg
DLCO unc % pred: 104 %
DLCO unc: 28.98 ml/min/mmHg
FEF 25-75 Post: 5.49 L/sec
FEF 25-75 Pre: 4.38 L/sec
FEF2575-%Change-Post: 25 %
FEF2575-%Pred-Post: 164 %
FEF2575-%Pred-Pre: 131 %
FEV1-%Change-Post: 8 %
FEV1-%Pred-Post: 110 %
FEV1-%Pred-Pre: 102 %
FEV1-Post: 4.11 L
FEV1-Pre: 3.79 L
FEV1FVC-%Change-Post: 0 %
FEV1FVC-%Pred-Pre: 108 %
FEV6-%Change-Post: 7 %
FEV6-%Pred-Post: 104 %
FEV6-%Pred-Pre: 97 %
FEV6-Post: 4.8 L
FEV6-Pre: 4.47 L
FEV6FVC-%Change-Post: 0 %
FEV6FVC-%Pred-Post: 103 %
FEV6FVC-%Pred-Pre: 103 %
FVC-%Change-Post: 7 %
FVC-%Pred-Post: 101 %
FVC-%Pred-Pre: 94 %
FVC-Post: 4.81 L
FVC-Pre: 4.47 L
Post FEV1/FVC ratio: 85 %
Post FEV6/FVC ratio: 100 %
Pre FEV1/FVC ratio: 85 %
Pre FEV6/FVC Ratio: 100 %
RV % pred: 90 %
RV: 1.73 L
TLC % pred: 100 %
TLC: 6.59 L

## 2020-06-11 NOTE — Progress Notes (Signed)
PFT done today. 

## 2020-11-09 ENCOUNTER — Other Ambulatory Visit: Payer: BC Managed Care – PPO

## 2020-11-12 ENCOUNTER — Other Ambulatory Visit: Payer: BC Managed Care – PPO

## 2020-11-17 ENCOUNTER — Ambulatory Visit: Payer: BC Managed Care – PPO | Admitting: Cardiology

## 2020-11-25 ENCOUNTER — Ambulatory Visit: Payer: BC Managed Care – PPO

## 2020-11-25 ENCOUNTER — Other Ambulatory Visit (HOSPITAL_COMMUNITY): Payer: Self-pay | Admitting: Cardiology

## 2020-11-25 ENCOUNTER — Other Ambulatory Visit: Payer: Self-pay

## 2020-11-25 DIAGNOSIS — I10 Essential (primary) hypertension: Secondary | ICD-10-CM

## 2020-11-26 LAB — LIPID PANEL
Chol/HDL Ratio: 3.2 ratio (ref 0.0–5.0)
Cholesterol, Total: 112 mg/dL (ref 100–199)
HDL: 35 mg/dL — ABNORMAL LOW (ref >39)
LDL Chol Calc (NIH): 63 mg/dL (ref 0–99)
Triglycerides: 66 mg/dL (ref 0–149)
VLDL Cholesterol Cal: 14 mg/dL (ref 5–40)

## 2020-12-01 DIAGNOSIS — I2510 Atherosclerotic heart disease of native coronary artery without angina pectoris: Secondary | ICD-10-CM | POA: Insufficient documentation

## 2020-12-01 DIAGNOSIS — R0609 Other forms of dyspnea: Secondary | ICD-10-CM | POA: Insufficient documentation

## 2020-12-01 DIAGNOSIS — R0600 Dyspnea, unspecified: Secondary | ICD-10-CM | POA: Insufficient documentation

## 2020-12-01 NOTE — Progress Notes (Signed)
Patient referred by Vicenta Aly, [place] for chest pain  Subjective:   Angelica Ran, male    DOB: September 30, 1970, 50 y.o.   MRN: [id number]   Chief Complaint  Patient presents with  . Coronary Artery Disease  . Follow-up  . Results     HPI  50 year old male with hypertension, type 2 DM, smokeless tobacco dependence, nonobstructive CAD, family h/o CAD.   Patient was originally referred to our office for evaluation of chest pain. Patient had undergone CCTA which showed mild nonobstructive CAD in the proximal LAD and RCA. He was last seen 05/19/2020 by Dr. Virgina Jock, at which time he had complaints of exertional dyspnea he therefore underwent echocardiogram and D-dimer.  D-dimer was negative and echocardiogram revealed only mild systolic and diastolic dysfunction.   Patient has had no recurrence of chest pain or dyspnea on exertion.  He is without specific complaints today.  Denies chest pain, palpitations, syncope, near syncope.  Denies leg edema, orthopnea, PND.  Patient reports most recent A1c is 7.5%, he reports he has a recheck coming up.   Current Outpatient Medications on File Prior to Visit  Medication Sig Dispense Refill  . albuterol (VENTOLIN HFA) 108 (90 Base) MCG/ACT inhaler Inhale 2 puffs into the lungs every 4 (four) hours as needed.    Marland Kitchen aspirin (ASPIRIN CHILDRENS) 81 MG chewable tablet Chew 1 tablet (81 mg total) by mouth daily. 30 tablet 2  . atorvastatin (LIPITOR) 40 MG tablet Take 1 tablet (40 mg total) by mouth every evening. 90 tablet 2  . fluticasone furoate-vilanterol (BREO ELLIPTA) 100-25 MCG/INH AEPB Inhale 1 puff into the lungs daily. 60 each 11  . gabapentin (NEURONTIN) 300 MG capsule Take 600 mg by mouth 2 (two) times daily.     . Insulin Detemir (LEVEMIR FLEXPEN [place]) Inject 58 Units into the skin every evening.    Marland Kitchen losartan (COZAAR) 100 MG tablet Take 100 mg by mouth daily.    . metFORMIN (GLUCOPHAGE) 1000 MG tablet Take 1,000 mg by mouth 2 (two) times  daily with a meal.    . nitroGLYCERIN (NITROSTAT) 0.4 MG SL tablet Place 1 tablet (0.4 mg total) under the tongue every 5 (five) minutes as needed for chest pain. 30 tablet 3  . sitaGLIPtin (JANUVIA) 100 MG tablet Take 100 mg by mouth daily.    . Testosterone Cypionate 200 MG/ML SOLN Inject 200 mg as directed every 21 ( twenty-one) days.     . metoprolol tartrate (LOPRESSOR) 50 MG tablet Take 1 tablet (50 mg total) by mouth 2 (two) times daily. 60 tablet 3   No current facility-administered medications on file prior to visit.    Cardiovascular and other pertinent studies:  EKG 12/02/2020:  Sinus rhythm at a rate of 80 bpm.  Left axis, left anterior fascicular block.  Poor R wave progression, cannot exclude anteroseptal infarct old.  No evidence of ischemia or infarct.  Compared to EKG 04/25/2020, no significant change.  Echocardiogram 11/25/2020:  Normal LV systolic function with visual EF 50-55%. Left ventricle cavity  is normal in size. Mild left ventricular hypertrophy. Normal global wall  motion. Indeterminate diastolic filling pattern, normal LAP.  Mild (Grade I) mitral regurgitation.  Trace tricuspid regurgitation. No evidence of pulmonary hypertension.  Compared to prior study dated 04/28/2020: LVEF improved from 45-50% to  50-55%, Moderate MR is now Mild. Otherwise no significant change.   CTA cor 05/12/2020: 1. Coronary calcium score of 39 AU. This was 35 percentile for age and  sex matched control. 2. Normal coronary origin with right dominance. 3. CADRADS = 2. 4. Proximal LAD has mild stenosis in due to mixed plaque. LCX is non dominant and overall patent. Proximal RCA has mild stenosis due to noncalcified plaque. 5.  Aortic atherosclerosis. CT FFR findings: 1. Left Main: FFR = 0.99 2. LAD: Proximal FFR = 0.98, Mid FFR = 0.94, Distal FFR = 0.85 3. LCX: Proximal FFR = 0.98, Distal FFR = 0.94 4. RCA: Proximal FFR = 0.97, Mid FFR =0.91, Distal FFR = 0.87  IMPRESSION: 1.   CT FFR analysis showed no significant stenosis.  Echocardiogram 04/28/2020:  Left ventricle cavity is normal in size and wall thickness. Mild global  hypokinesis. LVEF 45-50%. Doppler evidence of grade II (pseudonormal)  diastolic dysfunction, elevated LAP. Calculated EF 55%.  Left atrial cavity is mildly dilated.  Moderate (Grade II) mitral regurgitation.  Inadequate TR jet to estimate pulmonary artery systolic pressure. Normal  right atrial pressure.   EKG 04/25/2020: Sinus rhythm 91 bpm with sinus arrhythmia.  Left intrafascicular block.  Poor R wave progression.  No ischemic changes.   Recent labs: 11/25/2020: Chol 112, TG 66, HDL 35, LDL 63  04/25/2020:  Glucose 283. BUN/Cr 13/0.89. eGFR >60. Na/K 138/4.2. H/H 15/46. MCV 87. Platelets 313 Trop HS 3   Review of Systems  Constitutional: Negative for malaise/fatigue and weight gain.  Cardiovascular: Negative for chest pain, claudication, dyspnea on exertion, leg swelling, near-syncope, orthopnea, palpitations, paroxysmal nocturnal dyspnea and syncope.  Respiratory: Negative for shortness of breath.   Hematologic/Lymphatic: Does not bruise/bleed easily.  Gastrointestinal: Negative for melena.  Neurological: Negative for dizziness and weakness.        Vitals:   12/02/20 0910  BP: 122/76  Pulse: 95  Temp: 98 F (36.7 C)  SpO2: 99%     Body mass index is 28.43 kg/m. Filed Weights   12/02/20 0910  Weight: 187 lb (84.8 kg)     Objective:   Physical Exam Vitals and nursing note reviewed.  Constitutional:      General: He is not in acute distress. Neck:     Vascular: No JVD.  Cardiovascular:     Rate and Rhythm: Normal rate and regular rhythm.     Heart sounds: Normal heart sounds. No murmur heard.   Pulmonary:     Effort: Pulmonary effort is normal.     Breath sounds: Normal breath sounds. No wheezing or rales.  Abdominal:     General: Bowel sounds are normal. There is no distension.     Palpations:  Abdomen is soft.  Musculoskeletal:     Right lower leg: No edema.     Left lower leg: No edema.  Skin:    General: Skin is warm and dry.  Neurological:     General: No focal deficit present.     Mental Status: He is oriented to person, place, and time.  Psychiatric:        Mood and Affect: Mood normal.        Behavior: Behavior normal.         Assessment & Recommendations:   50 year old male with hypertension, type 2 DM, tobacco dependence, nonobstructive CAD, family h/o CAD   Exertional dyspnea, atypical chest pain: Patient reports symptoms of exertional dyspnea and atypical chest pain have both resolved.  He does have mild nonobstructive CAD on CTA and mild systolic and diastolic dysfunction, however do not feel he is fully explain his previous symptoms.  As he has had no  recurrence of symptoms, recommend continued primary prevention/risk factor management.  CAD: Mild, nonobstructive. Recommend aggressive risk factor modification. Recommend Aspirin 81 mg, metoprolol tartarate 50 mg bid, losartan 100 mg, atorvastatin to 40 mg  LDL down to 63.   Hypertension: Well controlled on metoprolol and losartan, will continue both.   Type 2 DM: Management as per PCP  Patient is otherwise stable from a cardiovascular standpoint.  Follow-up in 1 year, sooner if needed, for CAD and hypertension.   Alethia Berthold, PA-C 12/02/2020, 1:39 PM Office: [phone]

## 2020-12-02 ENCOUNTER — Ambulatory Visit: Payer: BC Managed Care – PPO | Admitting: Student

## 2020-12-02 ENCOUNTER — Encounter: Payer: Self-pay | Admitting: Student

## 2020-12-02 ENCOUNTER — Other Ambulatory Visit: Payer: Self-pay

## 2020-12-02 VITALS — BP 122/76 | HR 95 | Temp 98.0°F | Ht 68.0 in | Wt 187.0 lb

## 2020-12-02 DIAGNOSIS — I10 Essential (primary) hypertension: Secondary | ICD-10-CM

## 2020-12-02 DIAGNOSIS — R0600 Dyspnea, unspecified: Secondary | ICD-10-CM

## 2020-12-02 DIAGNOSIS — E119 Type 2 diabetes mellitus without complications: Secondary | ICD-10-CM

## 2020-12-02 DIAGNOSIS — R0609 Other forms of dyspnea: Secondary | ICD-10-CM

## 2020-12-02 DIAGNOSIS — I2510 Atherosclerotic heart disease of native coronary artery without angina pectoris: Secondary | ICD-10-CM

## 2020-12-03 ENCOUNTER — Ambulatory Visit: Payer: BC Managed Care – PPO | Admitting: Cardiology

## 2021-02-06 IMAGING — DX DG CHEST 2V
2 series · 2 of 2 positions shown · non-contrast
Comparison: None.

CLINICAL DATA: Chest pain

EXAM:
CHEST - 2 VIEW

[chest pa]
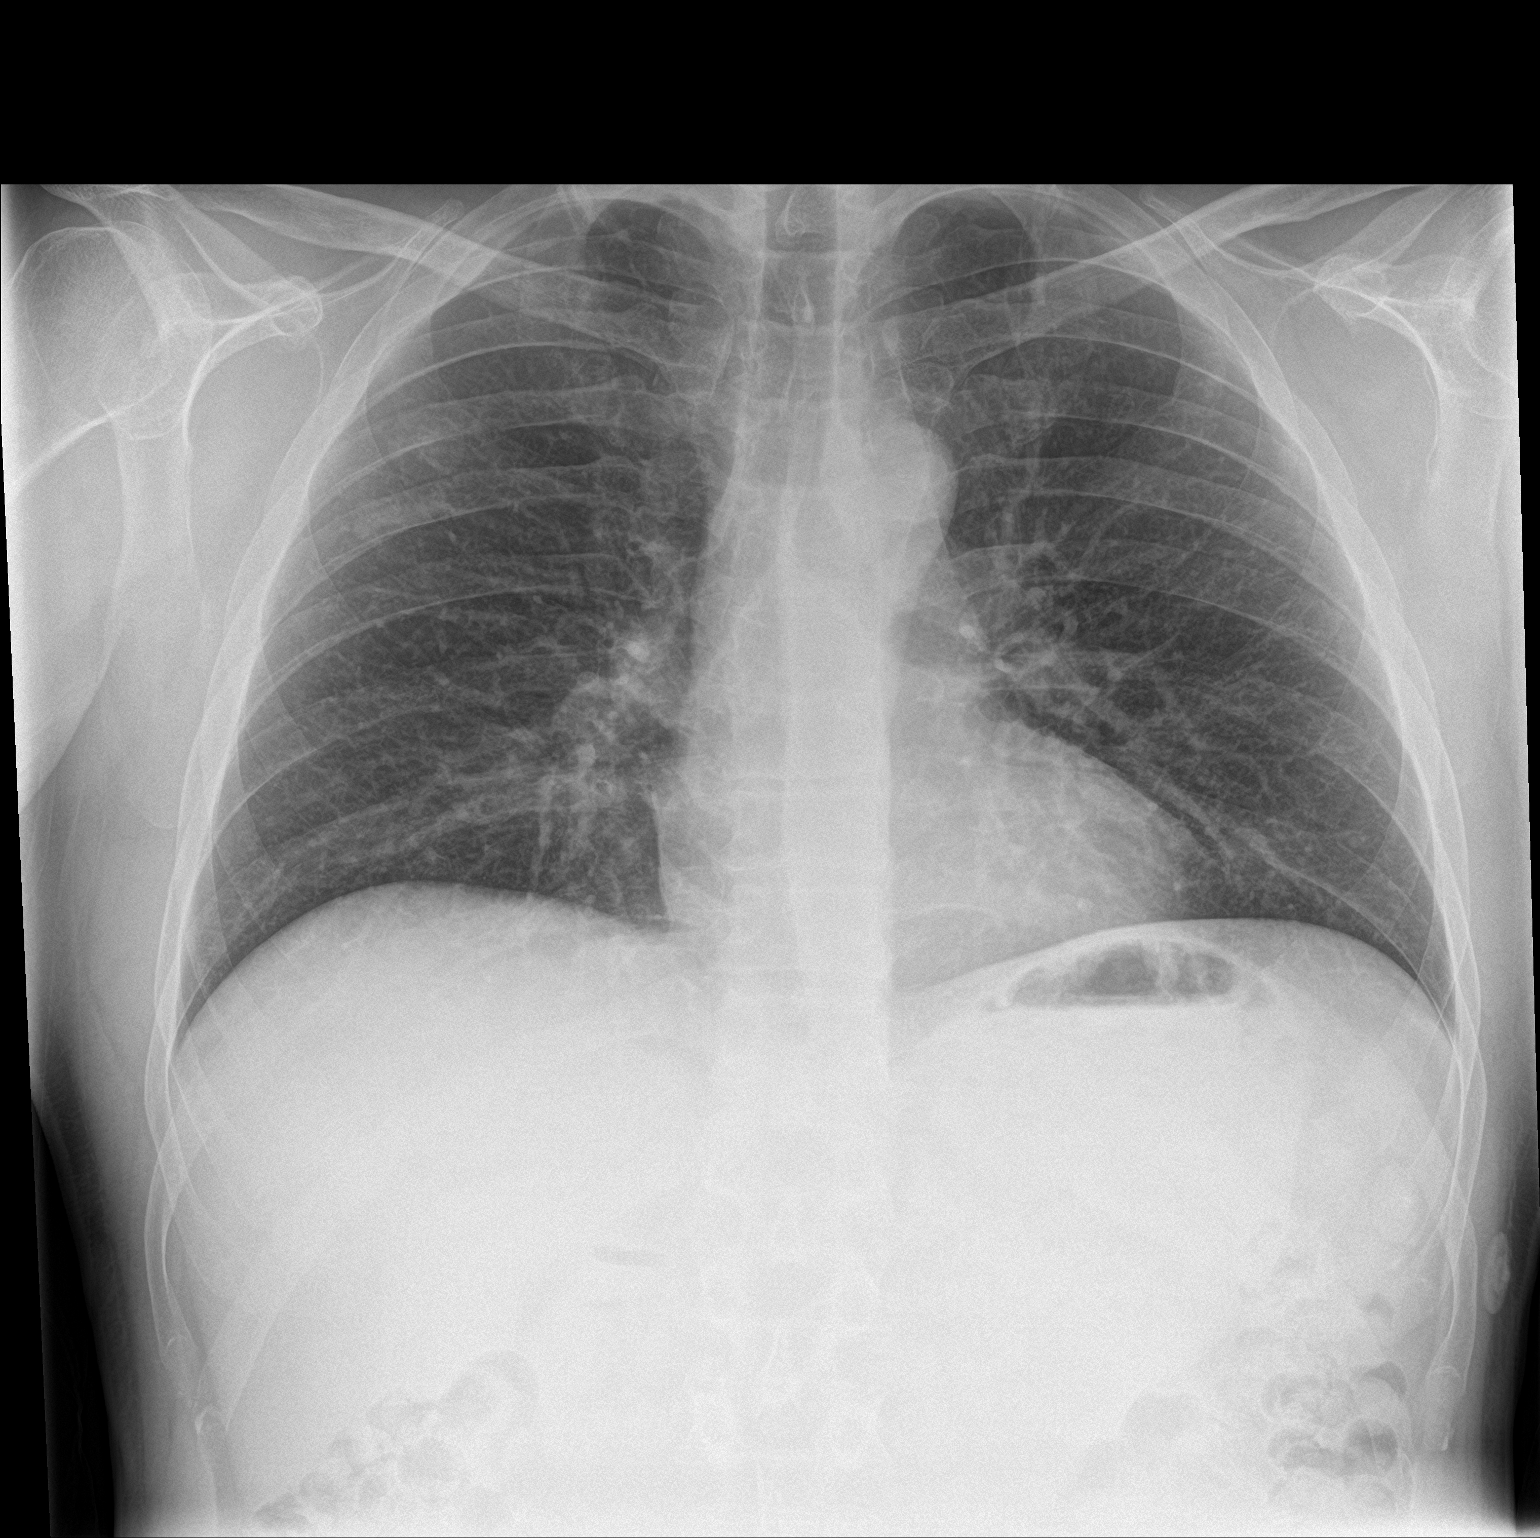

[chest lat]
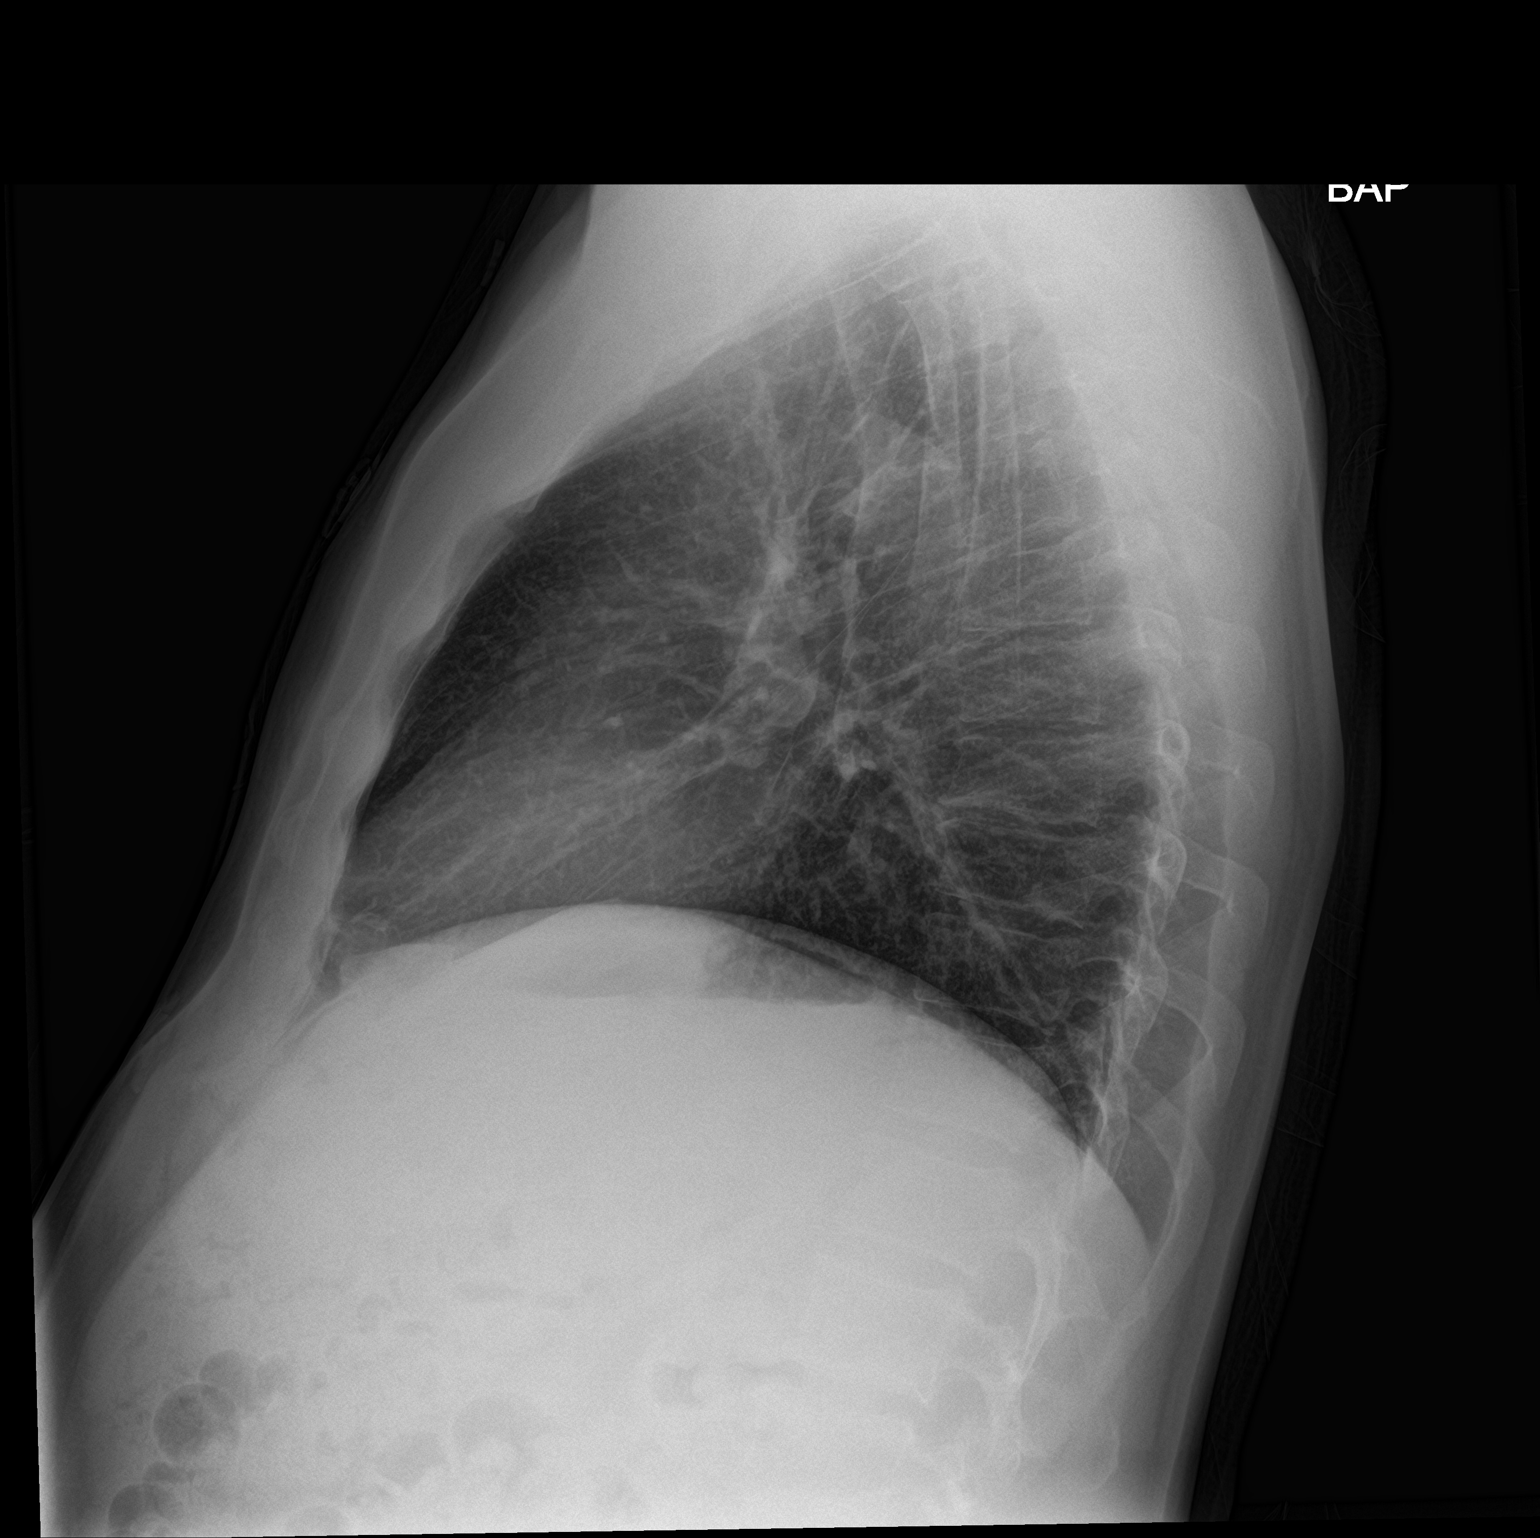

[2 of 2 positions shown; findings below may reference images not displayed]

FINDINGS: The heart size and mediastinal contours are within normal limits.
Both lungs are clear. The visualized skeletal structures are
unremarkable.
IMPRESSION: No active cardiopulmonary disease.

## 2021-06-02 ENCOUNTER — Ambulatory Visit (HOSPITAL_COMMUNITY): Payer: BC Managed Care – PPO | Attending: Pain Medicine | Admitting: Physical Therapy

## 2021-06-02 ENCOUNTER — Encounter (HOSPITAL_COMMUNITY): Payer: Self-pay | Admitting: Physical Therapy

## 2021-06-02 ENCOUNTER — Other Ambulatory Visit: Payer: Self-pay

## 2021-06-02 DIAGNOSIS — M542 Cervicalgia: Secondary | ICD-10-CM | POA: Diagnosis present

## 2021-06-02 DIAGNOSIS — R293 Abnormal posture: Secondary | ICD-10-CM | POA: Insufficient documentation

## 2021-06-02 DIAGNOSIS — M5412 Radiculopathy, cervical region: Secondary | ICD-10-CM

## 2021-06-02 NOTE — Patient Instructions (Signed)
Access Code: [email or web address] URL: [email or web address] Date: 06/02/2021 Prepared by: Georges Lynch  Exercises Seated Upright Posture Correction - 2-3 x daily - 7 x weekly - 2 sets - 10 reps - 5 second hold Seated Scapular Retraction - 2-3 x daily - 7 x weekly - 2 sets - 10 reps - 5 second hold Seated Cervical Retraction - 2-3 x daily - 7 x weekly - 2 sets - 10 reps - 5 second hold

## 2021-06-02 NOTE — Therapy (Signed)
[facility] [address] [place], [place], [address] Phone: [phone]   Fax:  [phone]  Physical Therapy Evaluation  Patient Details  Name: Tom Ellison MRN: [id number] Date of Birth: February 12, 1971 Referring Provider (PT): Jake Church MD   Encounter Date: 06/02/2021   PT End of Session - 06/02/21 1118     Visit Number 1    Number of Visits 8    Date for PT Re-Evaluation 06/30/21    Authorization Type BCBS COMM PPO    Authorization - Visit Number 1    Authorization - Number of Visits 30    PT Start Time 1035    PT Stop Time 1118    PT Time Calculation (min) 43 min    Activity Tolerance Patient tolerated treatment well    Behavior During Therapy [facility] for tasks assessed/performed             Past Medical History:  Diagnosis Date   GERD (gastroesophageal reflux disease)     History reviewed. No pertinent surgical history.  There were no vitals filed for this visit.    Subjective Assessment - 06/02/21 1044     Subjective Patient presents to therapy with complaint of neck pain with RT side radiculopathy. Began about 1-2 months ago. He notes radicular sx in RT arm have become progressively worse. He notes numbness in digits 4 and 5. He was given injection a few days ago. He says this has not yet kicked in. He is currently taking pain medication and gabapentin, which does provide temporary relief. He has had MRI which showed some bulging discs and deterioration in cervical spine.    Limitations Lifting;House hold activities;Other (comment)    Patient Stated Goals for it not to hurt    Currently in Pain? Yes    Pain Score 7     Pain Location Neck    Pain Orientation Left    Pain Descriptors / Indicators Shooting;Sharp    Pain Type Acute pain    Pain Radiating Towards Rt arm and hand    Pain Onset More than a month ago    Pain Frequency Constant    Aggravating Factors  looking up    Pain Relieving Factors meds, sleep     Effect of Pain on Daily Activities Limits                [facility] PT Assessment - 06/02/21 0001       Assessment   Medical Diagnosis Cervical Radiculopathy    Referring Provider (PT) Jake Church MD    Prior Therapy Yes      Precautions   Precautions None      Restrictions   Weight Bearing Restrictions No      Balance Screen   Has the patient fallen in the past 6 months No      Home Environment   Living Environment Private residence      Prior Function   Level of Independence Independent    Vocation Full time employment    Vocation Requirements [facility]      Cognition   Overall Cognitive Status Within Functional Limits for tasks assessed      Observation/Other Assessments   Focus on Therapeutic Outcomes (FOTO)  41% function      Posture/Postural Control   Posture/Postural Control Postural limitations    Postural Limitations Rounded Shoulders;Forward head      ROM / Strength   AROM / PROM / Strength AROM;Strength  AROM   AROM Assessment Site Cervical    Cervical Flexion 50    Cervical Extension 23   increased pain in RT arm   Cervical - Right Rotation 48   pain   Cervical - Left Rotation 46   pain     Strength   Overall Strength Comments 4/5 grossly throughout RT UE with pain    Strength Assessment Site Shoulder      Palpation   Palpation comment Mod TTP about RT upper trap, medial scapular border                        Objective measurements completed on examination: See above findings.       [facility] Adult PT Treatment/Exercise - 06/02/21 0001       Exercises   Exercises Shoulder      Shoulder Exercises: Seated   Other Seated Exercises posture corrections, chin tucks, scap retractions x 10                     PT Education - 06/02/21 1048     Education Details on evaluation findings, POC and HEP    Person(s) Educated Patient    Methods Explanation;Handout    Comprehension Verbalized understanding               PT Short Term Goals - 06/02/21 1149       PT SHORT TERM GOAL #1   Title Patient will be independent with initial HEP and self-management strategies to improve functional outcomes    Time 2    Period Weeks    Status New    Target Date 06/16/21               PT Long Term Goals - 06/02/21 1149       PT LONG TERM GOAL #1   Title Patient will be independent with advanced HEP and self-management strategies to improve functional outcomes    Time 4    Period Weeks    Status New    Target Date 06/30/21      PT LONG TERM GOAL #2   Title Patient will improve FOTO score to predicted value to indicate improvement in functional outcomes    Time 4    Period Weeks    Status New    Target Date 06/30/21      PT LONG TERM GOAL #3   Title Patient improve bilateral cervical rotation by >5 degrees in order to improve ability to scan environment for safety and while driving.    Time 4    Period Weeks    Status New    Target Date 06/30/21      PT LONG TERM GOAL #4   Title Patient will report at least 70% overall improvement in subjective complaint to indicate improvement in ability to perform ADLs.    Time 4    Period Weeks    Status New    Target Date 06/30/21                    Plan - 06/02/21 1146     Clinical Impression Statement Patient is a 50 y.o. male who presents to physical therapy with complaint of RT cervical radiculopathy. Patient demonstrates decreased strength, ROM restriction, reduced flexibility, increased tenderness to palpation and postural abnormalities which are likely contributing to symptoms of pain and are negatively impacting patient ability to perform ADLs. Patient will benefit from skilled physical  therapy services to address these deficits to reduce pain and improve level of function with ADLs    Examination-Activity Limitations Sleep;Reach Overhead;Carry;Lift    Examination-Participation Restrictions Community Activity;Shop;Occupation;Yard  Work;Cleaning    Stability/Clinical Decision Making Stable/Uncomplicated    Clinical Decision Making Low    Rehab Potential Good    PT Frequency 2x / week    PT Duration 4 weeks    PT Treatment/Interventions ADLs/Self Care Home Management;Biofeedback;Cryotherapy;Fluidtherapy;Contrast Bath;Electrical Stimulation;Therapeutic exercise;Orthotic Fit/Training;Patient/family education;Therapeutic activities;Ultrasound;Parrafin;Functional mobility training;Traction;Moist Heat;DME Instruction;Neuromuscular re-education;Iontophoresis 4mg /ml Dexamethasone;Scar mobilization;Passive range of motion;Visual/perceptual remediation/compensation;Dry needling;Manual techniques;Energy conservation;Spinal Manipulations;Splinting;Joint Manipulations;Vasopneumatic Device;Taping;Compression bandaging    PT Next Visit Plan Review goals and HEP. Progress postural strength, cervical and thoracic mobility as tolerated. Manual STM and thoracic mobs as needed.    PT Home Exercise Plan Eval: chin tuck, scap retraction, posture correction    Consulted and Agree with Plan of Care Patient             Patient will benefit from skilled therapeutic intervention in order to improve the following deficits and impairments:  Pain, Improper body mechanics, Increased fascial restricitons, Postural dysfunction, Decreased activity tolerance, Decreased range of motion, Decreased strength, Hypomobility, Impaired UE functional use, Impaired flexibility  Visit Diagnosis: Cervicalgia  Radiculopathy, cervical region  Abnormal posture     Problem List Patient Active Problem List   Diagnosis Date Noted   Coronary artery disease involving native coronary artery of native heart without angina pectoris 12/01/2020   Exertional dyspnea 12/01/2020   Asthma 06/09/2020   Atypical chest pain 05/18/2020   Essential hypertension 04/26/2020   Angina pectoris (HCC) 04/26/2020   Type 2 diabetes mellitus without complication, with long-term  current use of insulin (HCC) 04/26/2020   11:53 AM, 06/02/21 06/04/21 PT DPT  Physical Therapist with [facility]  [phone]  [facility] [address] [place], [place], [place] Phone: [phone]   Fax:  [phone]  Name: Tom Ellison MRN: [name] Date of Birth: Jan 15, 1971

## 2021-06-10 ENCOUNTER — Other Ambulatory Visit: Payer: Self-pay

## 2021-06-10 ENCOUNTER — Ambulatory Visit (HOSPITAL_COMMUNITY): Payer: BC Managed Care – PPO

## 2021-06-10 DIAGNOSIS — M5412 Radiculopathy, cervical region: Secondary | ICD-10-CM

## 2021-06-10 DIAGNOSIS — R293 Abnormal posture: Secondary | ICD-10-CM

## 2021-06-10 DIAGNOSIS — M542 Cervicalgia: Secondary | ICD-10-CM | POA: Diagnosis not present

## 2021-06-10 NOTE — Therapy (Signed)
[facility] [address] [place], [place], [address] Phone: [phone]   Fax:  [phone]  Physical Therapy Treatment  Patient Details  Name: Tom Ellison MRN: [id number] Date of Birth: [birth year] Referring Provider (PT): Jake Church MD   Encounter Date: 06/10/2021   PT End of Session - 06/10/21 0942     Visit Number 2    Number of Visits 8    Date for PT Re-Evaluation 06/30/21    Authorization Type BCBS COMM PPO    Authorization - Visit Number 2    Authorization - Number of Visits 30    PT Start Time 0945    PT Stop Time 1030    PT Time Calculation (min) 45 min    Activity Tolerance Patient tolerated treatment well    Behavior During Therapy [facility] for tasks assessed/performed             Past Medical History:  Diagnosis Date   GERD (gastroesophageal reflux disease)     No past surgical history on file.  There were no vitals filed for this visit.   Subjective Assessment - 06/10/21 0946     Subjective Neck pain has decreased some, still continuing to have numbess, pain, and weakness in LUE and 4th-5th digits and medial elbow                [facility] PT Assessment - 06/10/21 0001       Assessment   Medical Diagnosis Cervical Radiculopathy    Referring Provider (PT) Jake Church MD                           [facility] Adult PT Treatment/Exercise - 06/10/21 0001       Exercises   Exercises Neck      Neck Exercises: Seated   Neck Retraction 10 reps      Neck Exercises: Supine   Neck Retraction 10 reps    Neck Retraction Limitations with towel roll, and performing scapular depression      Neck Exercises: Stretches   Other Neck Stretches Upper Limb Tension floss for ulnar nerve RUE 3x10      Manual Therapy   Manual Therapy Soft tissue mobilization;Joint mobilization    Manual therapy comments completed separately from all other interventions    Joint Mobilization PA grade 2-3 t-spine to improve  extension    Soft tissue mobilization STM to right upper trap, scalene, levator to reduce spasm/guarding                       PT Short Term Goals - 06/02/21 1149       PT SHORT TERM GOAL #1   Title Patient will be independent with initial HEP and self-management strategies to improve functional outcomes    Time 2    Period Weeks    Status New    Target Date 06/16/21               PT Long Term Goals - 06/02/21 1149       PT LONG TERM GOAL #1   Title Patient will be independent with advanced HEP and self-management strategies to improve functional outcomes    Time 4    Period Weeks    Status New    Target Date 06/30/21      PT LONG TERM GOAL #2   Title Patient will improve FOTO score to predicted value to  indicate improvement in functional outcomes    Time 4    Period Weeks    Status New    Target Date 06/30/21      PT LONG TERM GOAL #3   Title Patient improve bilateral cervical rotation by >5 degrees in order to improve ability to scan environment for safety and while driving.    Time 4    Period Weeks    Status New    Target Date 06/30/21      PT LONG TERM GOAL #4   Title Patient will report at least 70% overall improvement in subjective complaint to indicate improvement in ability to perform ADLs.    Time 4    Period Weeks    Status New    Target Date 06/30/21                   Plan - 06/10/21 1037     Clinical Impression Statement Pt notes pain along his right side of neck and arm to 4th-5th digits and outlines ulnar nerve distribution in RUE.  Increased sensitivity to tension positions for ulnar n. Decreased thoracic extension with cervical extension and pt notes increase in symptoms with cervical extension. Continued sessions indicated to assess for centralization of symptoms    Examination-Activity Limitations Sleep;Reach Overhead;Carry;Lift    Examination-Participation Restrictions Community Activity;Shop;Occupation;Yard  Work;Cleaning    Stability/Clinical Decision Making Stable/Uncomplicated    Rehab Potential Good    PT Frequency 2x / week    PT Duration 4 weeks    PT Treatment/Interventions ADLs/Self Care Home Management;Biofeedback;Cryotherapy;Fluidtherapy;Contrast Bath;Electrical Stimulation;Therapeutic exercise;Orthotic Fit/Training;Patient/family education;Therapeutic activities;Ultrasound;Parrafin;Functional mobility training;Traction;Moist Heat;DME Instruction;Neuromuscular re-education;Iontophoresis 4mg /ml Dexamethasone;Scar mobilization;Passive range of motion;Visual/perceptual remediation/compensation;Dry needling;Manual techniques;Energy conservation;Spinal Manipulations;Splinting;Joint Manipulations;Vasopneumatic Device;Taping;Compression bandaging    PT Next Visit Plan Review goals and HEP. Progress postural strength, cervical and thoracic mobility as tolerated. Manual STM and thoracic mobs as needed.    PT Home Exercise Plan Eval: chin tuck, scap retraction, posture correction    Consulted and Agree with Plan of Care Patient             Patient will benefit from skilled therapeutic intervention in order to improve the following deficits and impairments:  Pain, Improper body mechanics, Increased fascial restricitons, Postural dysfunction, Decreased activity tolerance, Decreased range of motion, Decreased strength, Hypomobility, Impaired UE functional use, Impaired flexibility  Visit Diagnosis: Cervicalgia  Radiculopathy, cervical region  Abnormal posture     Problem List Patient Active Problem List   Diagnosis Date Noted   Coronary artery disease involving native coronary artery of native heart without angina pectoris 12/01/2020   Exertional dyspnea 12/01/2020   Asthma 06/09/2020   Atypical chest pain 05/18/2020   Essential hypertension 04/26/2020   Angina pectoris (HCC) 04/26/2020   Type 2 diabetes mellitus without complication, with long-term current use of insulin (HCC)  04/26/2020    04/28/2020, PT 06/10/2021, 10:39 AM  [facility] [address] [place], [place], [place] Phone: [phone]   Fax:  [phone]  Name: Tom Ellison MRN: [name] Date of Birth: 07/11/1971

## 2021-06-16 ENCOUNTER — Other Ambulatory Visit: Payer: Self-pay

## 2021-06-16 ENCOUNTER — Ambulatory Visit (HOSPITAL_COMMUNITY): Payer: BC Managed Care – PPO | Attending: Pain Medicine | Admitting: Physical Therapy

## 2021-06-16 DIAGNOSIS — R293 Abnormal posture: Secondary | ICD-10-CM | POA: Insufficient documentation

## 2021-06-16 DIAGNOSIS — M542 Cervicalgia: Secondary | ICD-10-CM | POA: Insufficient documentation

## 2021-06-16 DIAGNOSIS — M5412 Radiculopathy, cervical region: Secondary | ICD-10-CM | POA: Insufficient documentation

## 2021-06-16 NOTE — Therapy (Signed)
[facility] [address] [place], [place], [address] Phone: [phone]   Fax:  [phone]  Physical Therapy Treatment  Patient Details  Name: Tom Ellison MRN: [id number] Date of Birth: [birth year] Referring Provider (PT): Jake Church MD   Encounter Date: 06/16/2021   PT End of Session - 06/16/21 1046     Visit Number 3    Number of Visits 8    Date for PT Re-Evaluation 06/30/21    Authorization Type BCBS COMM PPO    Authorization - Visit Number 3    Authorization - Number of Visits 30    PT Start Time 1002    PT Stop Time 1038    PT Time Calculation (min) 36 min    Activity Tolerance Patient tolerated treatment well    Behavior During Therapy WFL for tasks assessed/performed             Past Medical History:  Diagnosis Date   GERD (gastroesophageal reflux disease)     No past surgical history on file.  There were no vitals filed for this visit.   Subjective Assessment - 06/16/21 1013     Subjective pt states the new exercise given last session is not agreeing with him.  States he also had to crank his generator Friday and has had increased pain ever since.  Currently 10/10.    Currently in Pain? Yes    Pain Score 10-Worst pain ever    Pain Location Neck    Pain Orientation Right    Pain Descriptors / Indicators Aching;Shooting;Radiating    Pain Radiating Towards into Rt UE to fingers                               [facility] Adult PT Treatment/Exercise - 06/16/21 0001       Neck Exercises: Seated   Neck Retraction 10 reps    W Back 10 reps    Other Seated Exercise gentle excursions 5X each way      Manual Therapy   Manual Therapy Soft tissue mobilization;Joint mobilization;Manual Traction    Manual therapy comments completed separately from all other interventions    Joint Mobilization occipital release    Soft tissue mobilization STM to right upper trap, scalene, levator to reduce  spasm/guarding    Manual Traction 4X30" holds                       PT Short Term Goals - 06/02/21 1149       PT SHORT TERM GOAL #1   Title Patient will be independent with initial HEP and self-management strategies to improve functional outcomes    Time 2    Period Weeks    Status New    Target Date 06/16/21               PT Long Term Goals - 06/02/21 1149       PT LONG TERM GOAL #1   Title Patient will be independent with advanced HEP and self-management strategies to improve functional outcomes    Time 4    Period Weeks    Status New    Target Date 06/30/21      PT LONG TERM GOAL #2   Title Patient will improve FOTO score to predicted value to indicate improvement in functional outcomes    Time 4    Period Weeks    Status  New    Target Date 06/30/21      PT LONG TERM GOAL #3   Title Patient improve bilateral cervical rotation by >5 degrees in order to improve ability to scan environment for safety and while driving.    Time 4    Period Weeks    Status New    Target Date 06/30/21      PT LONG TERM GOAL #4   Title Patient will report at least 70% overall improvement in subjective complaint to indicate improvement in ability to perform ADLs.    Time 4    Period Weeks    Status New    Target Date 06/30/21                   Plan - 06/16/21 1314     Clinical Impression Statement Pt with continued pain with radiating symptoms down into Rt fingertips.  Completed manual traction today with good relief, though temporary.  Tightness and spasms into Rt Upper trap and scap muscles reduced with manual.  Remains guarded due to pain.  Reported unchanged pain at end of session.    Personal Factors and Comorbidities Other    Examination-Activity Limitations Sleep;Reach Overhead;Carry;Lift    Examination-Participation Restrictions Community Activity;Shop;Occupation;Yard Work;Cleaning    Stability/Clinical Decision Making Stable/Uncomplicated     Rehab Potential Good    PT Frequency 2x / week    PT Duration 4 weeks    PT Treatment/Interventions ADLs/Self Care Home Management;Biofeedback;Cryotherapy;Fluidtherapy;Contrast Bath;Electrical Stimulation;Therapeutic exercise;Orthotic Fit/Training;Patient/family education;Therapeutic activities;Ultrasound;Parrafin;Functional mobility training;Traction;Moist Heat;DME Instruction;Neuromuscular re-education;Iontophoresis 4mg /ml Dexamethasone;Scar mobilization;Passive range of motion;Visual/perceptual remediation/compensation;Dry needling;Manual techniques;Energy conservation;Spinal Manipulations;Splinting;Joint Manipulations;Vasopneumatic Device;Taping;Compression bandaging    PT Next Visit Plan Review goals and HEP. Progress postural strength, cervical and thoracic mobility as tolerated. Manual STM and thoracic mobs as needed.    PT Home Exercise Plan Eval: chin tuck, scap retraction, posture correction    Consulted and Agree with Plan of Care Patient             Patient will benefit from skilled therapeutic intervention in order to improve the following deficits and impairments:  Pain, Improper body mechanics, Increased fascial restricitons, Postural dysfunction, Decreased activity tolerance, Decreased range of motion, Decreased strength, Hypomobility, Impaired UE functional use, Impaired flexibility  Visit Diagnosis: Cervicalgia  Radiculopathy, cervical region  Abnormal posture     Problem List Patient Active Problem List   Diagnosis Date Noted   Coronary artery disease involving native coronary artery of native heart without angina pectoris 12/01/2020   Exertional dyspnea 12/01/2020   Asthma 06/09/2020   Atypical chest pain 05/18/2020   Essential hypertension 04/26/2020   Angina pectoris (HCC) 04/26/2020   Type 2 diabetes mellitus without complication, with long-term current use of insulin (HCC) 04/26/2020   04/28/2020, PTA/CLT, WTA [id number]  [phone],  PTA 06/16/2021, 1:23 PM  [facility] [address] [place], [place], [place] Phone: [phone]   Fax:  [phone]  Name: Tom Ellison MRN: [name] Date of Birth: 1971/01/14

## 2021-06-20 ENCOUNTER — Telehealth (HOSPITAL_COMMUNITY): Payer: Self-pay | Admitting: Occupational Therapy

## 2021-06-20 NOTE — Telephone Encounter (Signed)
Pt called to cancel remaining 2 appts.  MD instructed to hold therapy until after pt has surgical consultation on 06/30/21. pt will call back to let us know if he is going to discharge or continue therapy.    Ezra Sites, OTR/L  [phone] 06/20/21

## 2021-06-24 ENCOUNTER — Encounter (HOSPITAL_COMMUNITY): Payer: BC Managed Care – PPO

## 2021-06-30 ENCOUNTER — Encounter (HOSPITAL_COMMUNITY): Payer: BC Managed Care – PPO | Admitting: Physical Therapy

## 2021-07-05 ENCOUNTER — Other Ambulatory Visit: Payer: Self-pay | Admitting: Cardiology

## 2021-07-05 DIAGNOSIS — I2510 Atherosclerotic heart disease of native coronary artery without angina pectoris: Secondary | ICD-10-CM

## 2021-07-29 ENCOUNTER — Other Ambulatory Visit: Payer: Self-pay | Admitting: Pulmonary Disease

## 2021-07-29 DIAGNOSIS — R0609 Other forms of dyspnea: Secondary | ICD-10-CM

## 2021-09-09 ENCOUNTER — Other Ambulatory Visit: Payer: Self-pay | Admitting: Pulmonary Disease

## 2021-09-09 DIAGNOSIS — R0609 Other forms of dyspnea: Secondary | ICD-10-CM

## 2021-09-09 NOTE — Telephone Encounter (Signed)
Left voicemail for patient to call back to schedule an OV with MH

## 2021-09-09 NOTE — Telephone Encounter (Signed)
Please call the patient to make an OV with Dr. Judeth Horn for his first available.

## 2021-09-10 ENCOUNTER — Other Ambulatory Visit: Payer: Self-pay | Admitting: Pulmonary Disease

## 2021-09-10 DIAGNOSIS — R0609 Other forms of dyspnea: Secondary | ICD-10-CM

## 2021-09-13 NOTE — Telephone Encounter (Signed)
appt needed

## 2021-09-16 NOTE — Telephone Encounter (Signed)
Left voicemail for patient to call back to schedule an OV with MH and sent mychart message.

## 2021-12-02 ENCOUNTER — Ambulatory Visit: Payer: BC Managed Care – PPO | Admitting: Student
